# Patient Record
Sex: Female | Born: 1935 | Race: White | Hispanic: No | Smoking: Never smoker
Health system: Southern US, Community
[De-identification: ages and names within clinical notes are randomized; demographics above are authoritative.]

## PROBLEM LIST (undated history)

## (undated) DIAGNOSIS — G894 Chronic pain syndrome: Secondary | ICD-10-CM

## (undated) DIAGNOSIS — I10 Essential (primary) hypertension: Secondary | ICD-10-CM

## (undated) DIAGNOSIS — E785 Hyperlipidemia, unspecified: Secondary | ICD-10-CM

## (undated) DIAGNOSIS — K219 Gastro-esophageal reflux disease without esophagitis: Secondary | ICD-10-CM

## (undated) DIAGNOSIS — J309 Allergic rhinitis, unspecified: Secondary | ICD-10-CM

## (undated) DIAGNOSIS — M1990 Unspecified osteoarthritis, unspecified site: Secondary | ICD-10-CM

## (undated) DIAGNOSIS — J45909 Unspecified asthma, uncomplicated: Secondary | ICD-10-CM

## (undated) HISTORY — DX: Gastro-esophageal reflux disease without esophagitis: K21.9

## (undated) HISTORY — DX: Unspecified osteoarthritis, unspecified site: M19.90

## (undated) HISTORY — DX: Hyperlipidemia, unspecified: E78.5

## (undated) HISTORY — DX: Unspecified asthma, uncomplicated: J45.909

## (undated) HISTORY — DX: Chronic pain syndrome: G89.4

## (undated) HISTORY — DX: Essential (primary) hypertension: I10

## (undated) HISTORY — PX: CHOLECYSTECTOMY: SHX55

## (undated) HISTORY — DX: Allergic rhinitis, unspecified: J30.9

## (undated) HISTORY — PX: LAMINECTOMY: SHX219

## (undated) HISTORY — PX: TUBAL LIGATION: SHX77

## (undated) HISTORY — PX: ABDOMINAL HYSTERECTOMY: SHX81

## (undated) HISTORY — PX: APPENDECTOMY: SHX54

---

## 2000-08-12 ENCOUNTER — Encounter: Payer: Self-pay | Admitting: Family Medicine

## 2000-08-12 ENCOUNTER — Ambulatory Visit (HOSPITAL_COMMUNITY): Admission: RE | Admit: 2000-08-12 | Discharge: 2000-08-12 | Payer: Self-pay | Admitting: Family Medicine

## 2001-09-03 ENCOUNTER — Ambulatory Visit (HOSPITAL_COMMUNITY): Admission: RE | Admit: 2001-09-03 | Discharge: 2001-09-03 | Payer: Self-pay | Admitting: Family Medicine

## 2001-09-03 ENCOUNTER — Encounter: Payer: Self-pay | Admitting: Family Medicine

## 2001-11-26 ENCOUNTER — Ambulatory Visit (HOSPITAL_COMMUNITY): Admission: RE | Admit: 2001-11-26 | Discharge: 2001-11-26 | Payer: Self-pay | Admitting: Family Medicine

## 2001-11-26 ENCOUNTER — Encounter: Payer: Self-pay | Admitting: Family Medicine

## 2002-08-20 ENCOUNTER — Emergency Department (HOSPITAL_COMMUNITY): Admission: EM | Admit: 2002-08-20 | Discharge: 2002-08-21 | Payer: Self-pay | Admitting: Emergency Medicine

## 2002-08-20 ENCOUNTER — Encounter: Payer: Self-pay | Admitting: Emergency Medicine

## 2004-06-18 ENCOUNTER — Ambulatory Visit (HOSPITAL_COMMUNITY): Admission: RE | Admit: 2004-06-18 | Discharge: 2004-06-18 | Payer: Self-pay | Admitting: Family Medicine

## 2010-02-03 ENCOUNTER — Encounter: Payer: Self-pay | Admitting: Pulmonary Disease

## 2010-02-04 ENCOUNTER — Encounter: Payer: Self-pay | Admitting: Family Medicine

## 2013-02-04 ENCOUNTER — Institutional Professional Consult (permissible substitution): Payer: Self-pay | Admitting: Internal Medicine

## 2013-02-10 ENCOUNTER — Encounter: Payer: Self-pay | Admitting: Internal Medicine

## 2013-02-11 ENCOUNTER — Ambulatory Visit (INDEPENDENT_AMBULATORY_CARE_PROVIDER_SITE_OTHER): Payer: Medicare Other | Admitting: Internal Medicine

## 2013-02-11 ENCOUNTER — Other Ambulatory Visit (INDEPENDENT_AMBULATORY_CARE_PROVIDER_SITE_OTHER): Payer: Medicare Other

## 2013-02-11 ENCOUNTER — Encounter: Payer: Self-pay | Admitting: Internal Medicine

## 2013-02-11 ENCOUNTER — Ambulatory Visit (INDEPENDENT_AMBULATORY_CARE_PROVIDER_SITE_OTHER)
Admission: RE | Admit: 2013-02-11 | Discharge: 2013-02-11 | Disposition: A | Discharge status: Home / Self Care | Payer: Medicare Other | Source: Ambulatory Visit | Attending: Internal Medicine | Admitting: Internal Medicine

## 2013-02-11 VITALS — BP 144/88 | HR 63 | Temp 98.2°F | Ht 66.0 in | Wt 193.0 lb

## 2013-02-11 DIAGNOSIS — R0600 Dyspnea, unspecified: Secondary | ICD-10-CM

## 2013-02-11 DIAGNOSIS — R0609 Other forms of dyspnea: Secondary | ICD-10-CM

## 2013-02-11 DIAGNOSIS — R0989 Other specified symptoms and signs involving the circulatory and respiratory systems: Secondary | ICD-10-CM

## 2013-02-11 LAB — CBC WITH DIFFERENTIAL/PLATELET
BASOS ABS: 0 10*3/uL (ref 0.0–0.1)
Basophils Relative: 0.2 % (ref 0.0–3.0)
EOS ABS: 0.3 10*3/uL (ref 0.0–0.7)
Eosinophils Relative: 3.5 % (ref 0.0–5.0)
HCT: 41.6 % (ref 36.0–46.0)
HEMOGLOBIN: 13.9 g/dL (ref 12.0–15.0)
Lymphocytes Relative: 28.9 % (ref 12.0–46.0)
Lymphs Abs: 2.5 10*3/uL (ref 0.7–4.0)
MCHC: 33.4 g/dL (ref 30.0–36.0)
MCV: 98 fl (ref 78.0–100.0)
Monocytes Absolute: 0.6 10*3/uL (ref 0.1–1.0)
Monocytes Relative: 6.9 % (ref 3.0–12.0)
NEUTROS ABS: 5.3 10*3/uL (ref 1.4–7.7)
Neutrophils Relative %: 60.5 % (ref 43.0–77.0)
Platelets: 305 10*3/uL (ref 150.0–400.0)
RBC: 4.25 Mil/uL (ref 3.87–5.11)
RDW: 13 % (ref 11.5–14.6)
WBC: 8.8 10*3/uL (ref 4.5–10.5)

## 2013-02-11 LAB — BASIC METABOLIC PANEL
BUN: 15 mg/dL (ref 6–23)
CHLORIDE: 103 meq/L (ref 96–112)
CO2: 31 meq/L (ref 19–32)
CREATININE: 0.7 mg/dL (ref 0.4–1.2)
Calcium: 10.4 mg/dL (ref 8.4–10.5)
GFR: 82.01 mL/min (ref >60.00)
Glucose, Bld: 100 mg/dL — ABNORMAL HIGH (ref 70–99)
Potassium: 4.1 mEq/L (ref 3.5–5.1)
Sodium: 140 mEq/L (ref 135–145)

## 2013-02-11 LAB — BRAIN NATRIURETIC PEPTIDE: Pro B Natriuretic peptide (BNP): 96 pg/mL (ref 0.0–100.0)

## 2013-02-11 LAB — TSH: TSH: 1.63 u[IU]/mL (ref 0.35–5.50)

## 2013-02-11 NOTE — Patient Instructions (Addendum)
Please remember to go to the lab and x-ray department downstairs for your tests - we will call you with the results when they are available.  In meantime I recommend you walk at a slower pace   Please schedule a follow up office visit in 4 weeks, sooner if needed with pfts on return

## 2013-02-11 NOTE — Progress Notes (Signed)
   Subjective:    Patient ID: Candice Hunter, female    DOB: 02-Nov-1933   MRN: [id number]  HPI  89 yowf never smoker with new onset doe x winter 2014 with unexplained hypoxemia referred 02/11/2013 by Dr Jenean Lindau to pulmonary clinic.   02/11/2013 1st [facility] office visit/ Candice Hunter cc indolent onset  X 1 year progressive doe now to point where has to stop (sometimes half way) walking mailbox to house off 02.  New cough for several months mostly in ams, dry.  eval at [facility] around 8 years ago > dx asthma rec inhalers   No obvious day to day or daytime variabilty or assoc  cp or chest tightness, subjective wheeze overt sinus or hb symptoms. No unusual exp hx or h/o childhood pna/ asthma or knowledge of premature birth.  Sleeping ok without nocturnal  or early am exacerbation  of respiratory  c/o's or need for noct saba. Also denies any obvious fluctuation of symptoms with weather or environmental changes or other aggravating or alleviating factors except as outlined above   Current Medications, Allergies, Complete Past Medical History, Past Surgical History, Family History, and Social History were reviewed in [employer] record.      Review of Systems  Constitutional: Negative for fever, chills and unexpected weight change.  HENT: Positive for dental problem, sneezing and trouble swallowing. Negative for congestion, ear pain, nosebleeds, postnasal drip, rhinorrhea, sinus pressure, sore throat and voice change.   Eyes: Negative for visual disturbance.  Respiratory: Positive for cough and shortness of breath. Negative for choking.   Cardiovascular: Positive for leg swelling. Negative for chest pain.  Gastrointestinal: Negative for vomiting, abdominal pain and diarrhea.  Genitourinary: Negative for difficulty urinating.  Musculoskeletal: Positive for arthralgias.  Skin: Negative for rash.  Neurological: Positive for headaches. Negative for tremors and syncope.   Hematological: Does not bruise/bleed easily.       Objective:   Physical Exam  amb wf nad   Wt Readings from Last 3 Encounters:  02/11/13 193 lb (87.544 kg)     HEENT: nl dentition, turbinates, and orophanx. Nl external ear canals without cough reflex   NECK :  without JVD/Nodes/TM/ nl carotid upstrokes bilaterally   LUNGS: no acc muscle use, clear to A and P bilaterally without cough on insp or exp maneuvers   CV:  RRR  no s3 or murmur or increase in P2, no edema   ABD:  soft and nontender with nl excursion in the supine position. No bruits or organomegaly, bowel sounds nl  MS:  warm without deformities, calf tenderness, cyanosis or clubbing  SKIN: warm and dry without lesions    NEURO:  alert, approp, no deficits     CXR  02/11/2013 :   Mild bibasilar scarring. No acute abnormality.    02/11/2013 labs Nl bmet, cbc, bnp, tsh    Assessment & Plan:

## 2013-02-12 LAB — D-DIMER, QUANTITATIVE: D-Dimer, Quant: 0.52 ug/mL-FEU — ABNORMAL HIGH (ref 0.00–0.48)

## 2013-02-13 NOTE — Assessment & Plan Note (Addendum)
-   02/11/2013  Walked RA x 2 laps @ 185 ft each stopped due to  Hips hurt, walked unsteady gait due to hips, no desat   Symptoms are markedly disproportionate to objective findings and not clear this is a lung problem but pt does appear to have difficult airway management issues. DDX of  difficult airways managment all start with A and  include Adherence, Ace Inhibitors, Acid Reflux, Active Sinus Disease, Alpha 1 Antitripsin deficiency, Anxiety masquerading as Airways dz,  ABPA,  allergy(esp in young), Aspiration (esp in elderly), Adverse effects of DPI,  Active smokers, plus two Bs  = Bronchiectasis and Beta blocker use..and one C= CHF  Adherence is always the initial "prime suspect" and is a multilayered concern that requires a "trust but verify" approach in every patient - starting with knowing how to use medications, especially inhalers, correctly, keeping up with refills and understanding the fundamental difference between maintenance and prns vs those medications only taken for a very short course and then stopped and not refilled.   ? Acid (or non-acid) GERD > always difficult to exclude as up to 75% of pts in some series report no assoc GI/ Heartburn symptoms> rec continue max (24h)  acid suppression and diet restrictions/ reviewed and instructions given in writing.   ? chf > exlcuded by BNP < 100 today   ? Anxiety/ deconditioning strongly implicated in today's  Observed walking - will have her return for full pfts and go from there

## 2013-03-23 ENCOUNTER — Ambulatory Visit: Payer: Medicare Other | Admitting: Internal Medicine

## 2014-07-20 IMAGING — CR DG CHEST 2V
2 series · 2 of 2 positions shown · non-contrast
Comparison: 03/29/2009

CLINICAL DATA: Short of breath

EXAM:
CHEST  2 VIEW

[view not recorded (1 of 2)]
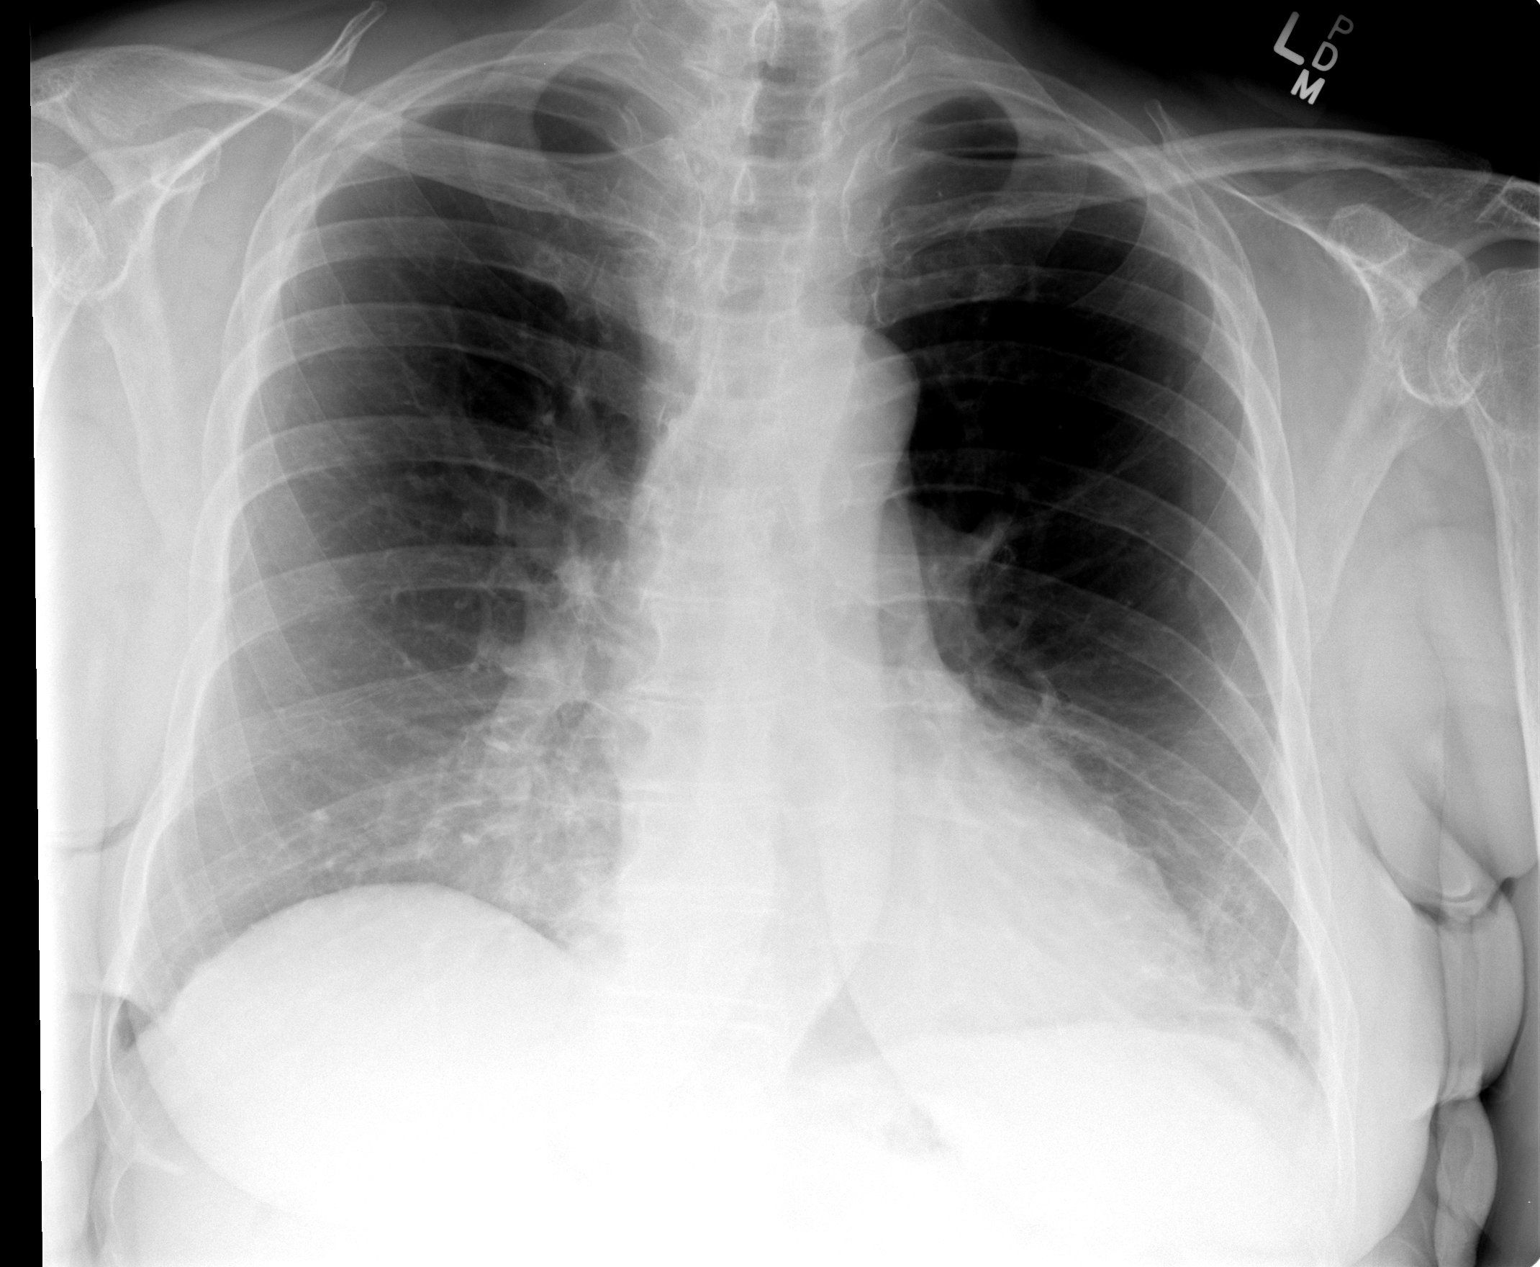

[view not recorded (2 of 2)]
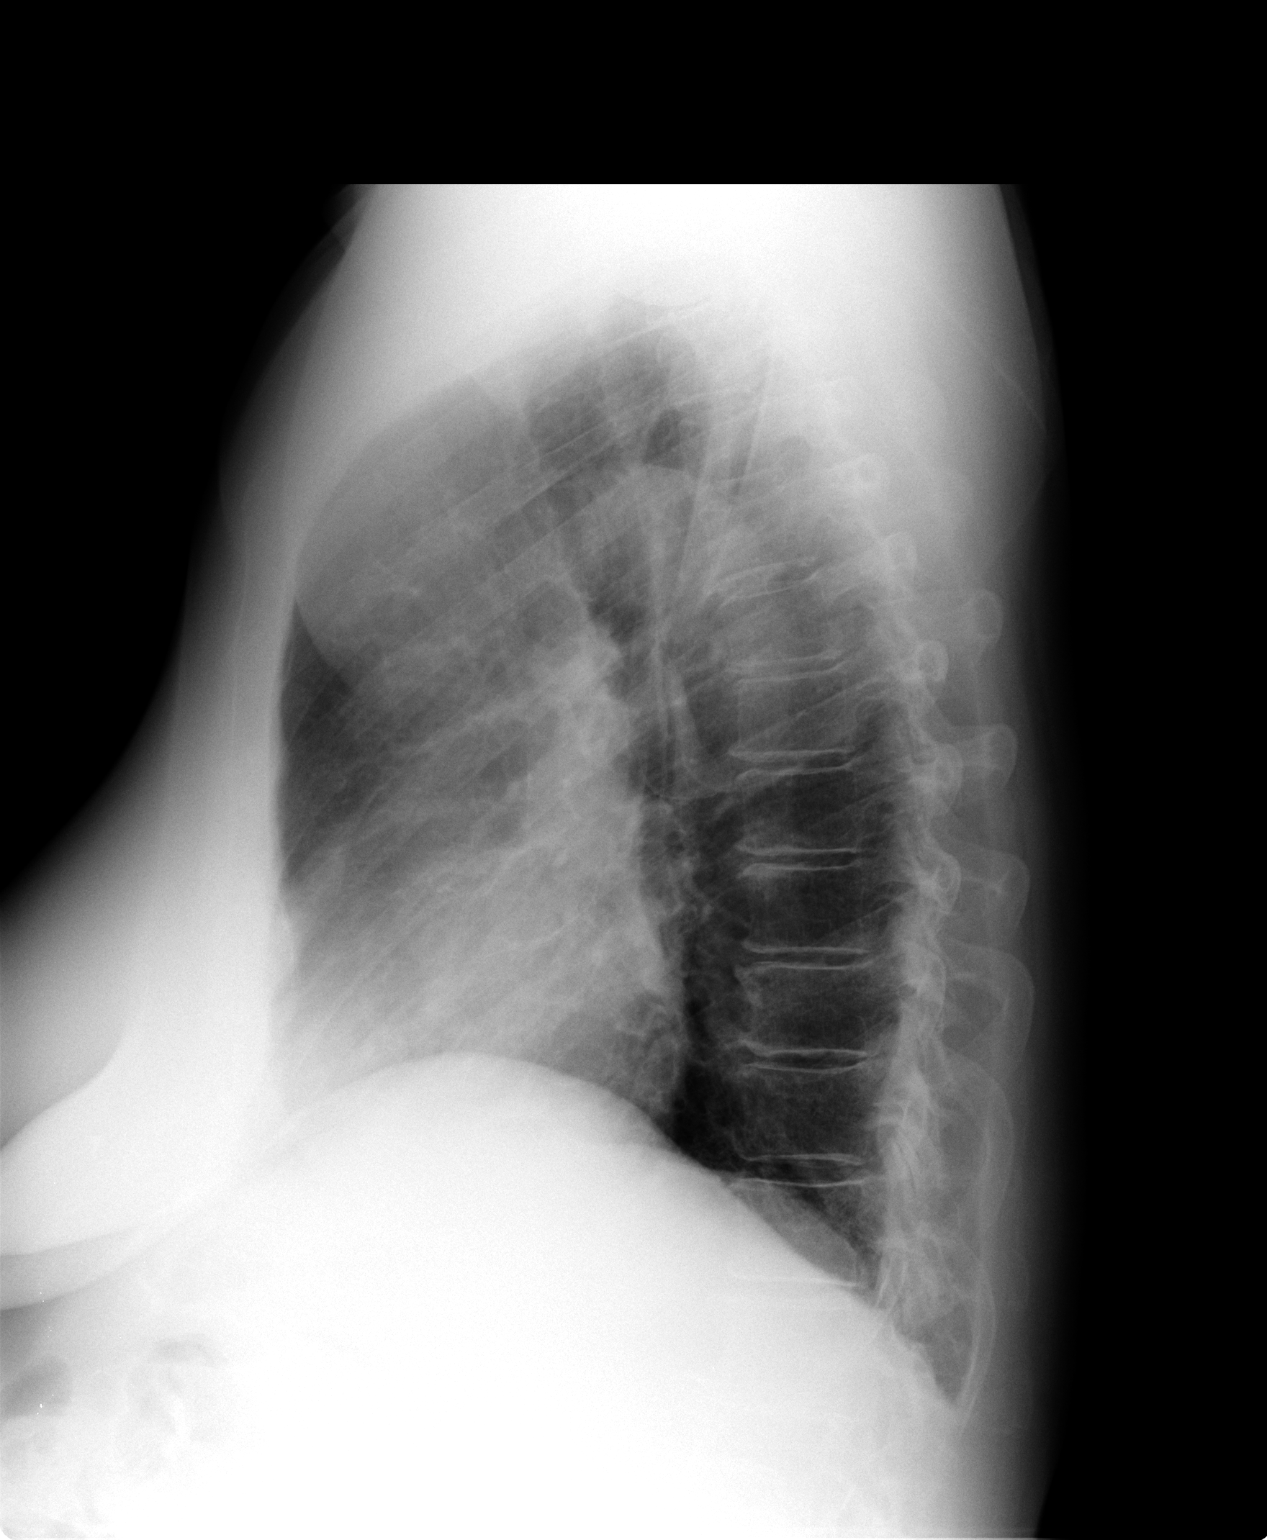

[2 of 2 positions shown; findings below may reference images not displayed]

FINDINGS: Mild scarring in the lung bases. Negative for pneumonia or heart
failure. No pleural effusion.
IMPRESSION: Mild bibasilar scarring.  No acute abnormality.

## 2015-07-20 DIAGNOSIS — R918 Other nonspecific abnormal finding of lung field: Secondary | ICD-10-CM

## 2015-07-20 DIAGNOSIS — C3411 Malignant neoplasm of upper lobe, right bronchus or lung: Secondary | ICD-10-CM | POA: Diagnosis not present

## 2015-07-20 DIAGNOSIS — J189 Pneumonia, unspecified organism: Secondary | ICD-10-CM

## 2015-07-20 DIAGNOSIS — J9691 Respiratory failure, unspecified with hypoxia: Secondary | ICD-10-CM | POA: Diagnosis not present

## 2015-07-20 DIAGNOSIS — I10 Essential (primary) hypertension: Secondary | ICD-10-CM | POA: Diagnosis not present

## 2015-07-21 DIAGNOSIS — C3411 Malignant neoplasm of upper lobe, right bronchus or lung: Secondary | ICD-10-CM | POA: Diagnosis not present

## 2015-07-21 DIAGNOSIS — I10 Essential (primary) hypertension: Secondary | ICD-10-CM | POA: Diagnosis not present

## 2015-07-21 DIAGNOSIS — J189 Pneumonia, unspecified organism: Secondary | ICD-10-CM

## 2015-07-21 DIAGNOSIS — J9691 Respiratory failure, unspecified with hypoxia: Secondary | ICD-10-CM | POA: Diagnosis not present

## 2015-07-21 DIAGNOSIS — R918 Other nonspecific abnormal finding of lung field: Secondary | ICD-10-CM

## 2015-07-22 DIAGNOSIS — J189 Pneumonia, unspecified organism: Secondary | ICD-10-CM | POA: Diagnosis not present

## 2015-07-22 DIAGNOSIS — R918 Other nonspecific abnormal finding of lung field: Secondary | ICD-10-CM | POA: Diagnosis not present

## 2015-07-22 DIAGNOSIS — J9691 Respiratory failure, unspecified with hypoxia: Secondary | ICD-10-CM | POA: Diagnosis not present

## 2015-07-22 DIAGNOSIS — I10 Essential (primary) hypertension: Secondary | ICD-10-CM | POA: Diagnosis not present

## 2015-07-23 DIAGNOSIS — I10 Essential (primary) hypertension: Secondary | ICD-10-CM | POA: Diagnosis not present

## 2015-07-23 DIAGNOSIS — R918 Other nonspecific abnormal finding of lung field: Secondary | ICD-10-CM | POA: Diagnosis not present

## 2015-07-23 DIAGNOSIS — J189 Pneumonia, unspecified organism: Secondary | ICD-10-CM | POA: Diagnosis not present

## 2015-07-23 DIAGNOSIS — J9691 Respiratory failure, unspecified with hypoxia: Secondary | ICD-10-CM | POA: Diagnosis not present

## 2015-07-24 DIAGNOSIS — J9691 Respiratory failure, unspecified with hypoxia: Secondary | ICD-10-CM | POA: Diagnosis not present

## 2015-07-24 DIAGNOSIS — J189 Pneumonia, unspecified organism: Secondary | ICD-10-CM | POA: Diagnosis not present

## 2015-07-24 DIAGNOSIS — R918 Other nonspecific abnormal finding of lung field: Secondary | ICD-10-CM | POA: Diagnosis not present

## 2015-07-24 DIAGNOSIS — C3411 Malignant neoplasm of upper lobe, right bronchus or lung: Secondary | ICD-10-CM | POA: Diagnosis not present

## 2015-07-24 DIAGNOSIS — I10 Essential (primary) hypertension: Secondary | ICD-10-CM | POA: Diagnosis not present

## 2015-07-25 DIAGNOSIS — J9691 Respiratory failure, unspecified with hypoxia: Secondary | ICD-10-CM | POA: Diagnosis not present

## 2015-07-25 DIAGNOSIS — J189 Pneumonia, unspecified organism: Secondary | ICD-10-CM | POA: Diagnosis not present

## 2015-07-25 DIAGNOSIS — I10 Essential (primary) hypertension: Secondary | ICD-10-CM | POA: Diagnosis not present

## 2015-07-25 DIAGNOSIS — R918 Other nonspecific abnormal finding of lung field: Secondary | ICD-10-CM | POA: Diagnosis not present

## 2015-08-15 DEATH — deceased
